# Patient Record
Sex: Male | Born: 1965 | Race: White | Hispanic: No | Marital: Married | Smoking: Never smoker
Health system: Southern US, Community
[De-identification: ages and names within clinical notes are randomized; demographics above are authoritative.]

## PROBLEM LIST (undated history)

## (undated) DIAGNOSIS — E119 Type 2 diabetes mellitus without complications: Secondary | ICD-10-CM

## (undated) DIAGNOSIS — I10 Essential (primary) hypertension: Secondary | ICD-10-CM

---

## 1999-12-24 ENCOUNTER — Emergency Department (HOSPITAL_COMMUNITY): Admission: EM | Admit: 1999-12-24 | Discharge: 1999-12-24 | Payer: Self-pay

## 1999-12-24 ENCOUNTER — Encounter: Payer: Self-pay | Admitting: Emergency Medicine

## 2017-09-08 HISTORY — PX: STENT PLACEMENT VASCULAR (ARMC HX): HXRAD1737

## 2017-12-21 ENCOUNTER — Encounter (HOSPITAL_COMMUNITY): Payer: Self-pay | Admitting: Emergency Medicine

## 2017-12-21 ENCOUNTER — Emergency Department (HOSPITAL_COMMUNITY)
Admission: EM | Admit: 2017-12-21 | Discharge: 2017-12-21 | Disposition: A | Discharge status: Home / Self Care | Payer: BLUE CROSS/BLUE SHIELD | Attending: Emergency Medicine | Admitting: Emergency Medicine

## 2017-12-21 ENCOUNTER — Other Ambulatory Visit: Payer: Self-pay

## 2017-12-21 ENCOUNTER — Emergency Department (HOSPITAL_COMMUNITY): Payer: BLUE CROSS/BLUE SHIELD

## 2017-12-21 DIAGNOSIS — Z7902 Long term (current) use of antithrombotics/antiplatelets: Secondary | ICD-10-CM | POA: Diagnosis not present

## 2017-12-21 DIAGNOSIS — Z79899 Other long term (current) drug therapy: Secondary | ICD-10-CM | POA: Insufficient documentation

## 2017-12-21 DIAGNOSIS — Y999 Unspecified external cause status: Secondary | ICD-10-CM | POA: Insufficient documentation

## 2017-12-21 DIAGNOSIS — Y9389 Activity, other specified: Secondary | ICD-10-CM | POA: Insufficient documentation

## 2017-12-21 DIAGNOSIS — E119 Type 2 diabetes mellitus without complications: Secondary | ICD-10-CM | POA: Diagnosis not present

## 2017-12-21 DIAGNOSIS — S93491A Sprain of other ligament of right ankle, initial encounter: Secondary | ICD-10-CM | POA: Diagnosis not present

## 2017-12-21 DIAGNOSIS — Y92002 Bathroom of unspecified non-institutional (private) residence single-family (private) house as the place of occurrence of the external cause: Secondary | ICD-10-CM | POA: Insufficient documentation

## 2017-12-21 DIAGNOSIS — I10 Essential (primary) hypertension: Secondary | ICD-10-CM | POA: Insufficient documentation

## 2017-12-21 DIAGNOSIS — S99911A Unspecified injury of right ankle, initial encounter: Secondary | ICD-10-CM | POA: Diagnosis present

## 2017-12-21 DIAGNOSIS — Z7984 Long term (current) use of oral hypoglycemic drugs: Secondary | ICD-10-CM | POA: Insufficient documentation

## 2017-12-21 DIAGNOSIS — X500XXA Overexertion from strenuous movement or load, initial encounter: Secondary | ICD-10-CM | POA: Diagnosis not present

## 2017-12-21 HISTORY — DX: Type 2 diabetes mellitus without complications: E11.9

## 2017-12-21 HISTORY — DX: Essential (primary) hypertension: I10

## 2017-12-21 MED ORDER — HYDROCODONE-ACETAMINOPHEN 5-325 MG PO TABS: 1.0000 | ORAL_TABLET | ORAL | 0 refills | Status: DC | PRN

## 2017-12-21 MED ORDER — HYDROCODONE-ACETAMINOPHEN 5-325 MG PO TABS: 1.0000 | ORAL_TABLET | ORAL | 0 refills | Status: AC | PRN

## 2017-12-21 MED ORDER — HYDROCODONE-ACETAMINOPHEN 5-325 MG PO TABS
1.0000 | ORAL_TABLET | Freq: Once | ORAL | Status: AC
  Administered 2017-12-21: 1 via ORAL
  Filled 2017-12-21: qty 1

## 2017-12-21 NOTE — ED Notes (Signed)
Ice pack given

## 2017-12-21 NOTE — ED Provider Notes (Signed)
[facility] EMERGENCY DEPARTMENT Provider Note   CSN: [id number] Arrival date & time: 12/21/17  1923     History   Chief Complaint Chief Complaint  Patient presents with  . Ankle Pain    right    HPI Ernest Davis is a 52 y.o. male presenting with right foot and  ankle pain which occurred suddenly when the patient rolled his foot early this am when walking.  Pain is aching, constant and worse with palpation, movement and weight bearing.  The patient was able to weight bear immediately after the event but his sx have worsened and is now unable to weight bear.  There is no radiation of pain and the patient denies numbness distal to the injury site.  The patients treatment prior to arrival included rest, ibuprofen and tylenol with no relief of pain. .  The history is provided by the patient.    Past Medical History:  Diagnosis Date  . Diabetes mellitus without complication (HCC)   . Hypertension     There are no active problems to display for this patient.     Home Medications    Prior to Admission medications   Medication Sig Start Date End Date Taking? Authorizing Provider  clopidogrel (PLAVIX) 75 MG tablet Take 75 mg by mouth daily.   Yes [provider]  glipiZIDE (GLUCOTROL) 10 MG tablet Take 10 mg by mouth 2 (two) times daily.   Yes [provider]  lisinopril-hydrochlorothiazide (PRINZIDE,ZESTORETIC) 20-25 MG tablet Take 1 tablet by mouth daily.   Yes [provider]  metFORMIN (GLUCOPHAGE) 500 MG tablet Take 500 mg by mouth 2 (two) times daily with a meal.   Yes [provider]  HYDROcodone-acetaminophen (NORCO/VICODIN) 5-325 MG tablet Take 1 tablet by mouth every 4 (four) hours as needed. 12/21/17   Burgess Amor, PA-C    Family History No family history on file.  Social History Social History   Tobacco Use  . Smoking status: Never Smoker  . Smokeless tobacco: Never Used  Substance Use Topics  . Alcohol use: No   Frequency: Never  . Drug use: No     Allergies   Jardiance [empagliflozin] and Penicillins   Review of Systems Review of Systems  Musculoskeletal: Positive for arthralgias and joint swelling.  Skin: Negative for wound.  Neurological: Negative for weakness and numbness.     Physical Exam Updated Vital Signs BP 121/72 (BP Location: Right Arm)   Pulse 95   Temp 98 F (36.7 C) (Oral)   Resp 16   Ht 5\' 10"  (1.778 m)   Wt 115.7 kg (255 lb)   SpO2 97%   BMI 36.59 kg/m   Physical Exam  Constitutional: He appears well-developed and well-nourished.  HENT:  Head: Normocephalic.  Cardiovascular: Normal rate and intact distal pulses. Exam reveals no decreased pulses.  Pulses:      Dorsalis pedis pulses are 2+ on the right side, and 2+ on the left side.  Musculoskeletal: He exhibits edema and tenderness.       Right ankle: He exhibits decreased range of motion, swelling and ecchymosis. He exhibits no deformity and normal pulse. Tenderness. Lateral malleolus and medial malleolus tenderness found. No head of 5th metatarsal and no proximal fibula tenderness found. Achilles tendon normal.       Right foot: There is tenderness and swelling. There is normal capillary refill, no crepitus and no deformity.  Neurological: He is alert. No sensory deficit.  Skin: Skin is warm, dry and  intact.  Nursing note and vitals reviewed.    ED Treatments / Results  Labs (all labs ordered are listed, but only abnormal results are displayed) Labs Reviewed - No data to display  EKG  EKG Interpretation None       Radiology Dg Ankle Complete Right  Result Date: 12/21/2017 CLINICAL DATA:  52 y/o M; fall with pain in the right foot and ankle. EXAM: RIGHT FOOT COMPLETE - 3+ VIEW; RIGHT ANKLE - COMPLETE 3+ VIEW COMPARISON:  None. FINDINGS: Right foot: Small ossific density adjacent to the fifth metatarsal head. Lisfranc alignment is maintained. Calcification projects over plantar fascia. Right ankle:  There is no evidence of fracture or dislocation. Talar dome is intact. Ankle mortise is symmetric on these nonstress views. Osteophyte production of talar neck may represent anterior ankle impingement. Dorsal calcaneal enthesophyte. IMPRESSION: 1. Small ossific density adjacent to fifth metatarsal head may represent ossicle or avulsion fracture. Correlation for focal tenderness recommended. 2. Calcification of plantar fascia may represent fasciitis. 3. Osteophyte production of talar neck may represent anterior ankle impingement in the appropriate clinical setting. 4. No ankle fracture or dislocation identified. Electronically Signed   By: Mitzi Hansen M.D.   On: 12/21/2017 21:20   Dg Foot Complete Right  Result Date: 12/21/2017 CLINICAL DATA:  52 y/o M; fall with pain in the right foot and ankle. EXAM: RIGHT FOOT COMPLETE - 3+ VIEW; RIGHT ANKLE - COMPLETE 3+ VIEW COMPARISON:  None. FINDINGS: Right foot: Small ossific density adjacent to the fifth metatarsal head. Lisfranc alignment is maintained. Calcification projects over plantar fascia. Right ankle: There is no evidence of fracture or dislocation. Talar dome is intact. Ankle mortise is symmetric on these nonstress views. Osteophyte production of talar neck may represent anterior ankle impingement. Dorsal calcaneal enthesophyte. IMPRESSION: 1. Small ossific density adjacent to fifth metatarsal head may represent ossicle or avulsion fracture. Correlation for focal tenderness recommended. 2. Calcification of plantar fascia may represent fasciitis. 3. Osteophyte production of talar neck may represent anterior ankle impingement in the appropriate clinical setting. 4. No ankle fracture or dislocation identified. Electronically Signed   By: Mitzi Hansen M.D.   On: 12/21/2017 21:20    Procedures Procedures (including critical care time)  Medications Ordered in ED Medications  HYDROcodone-acetaminophen (NORCO/VICODIN) 5-325 MG per  tablet 1 tablet (1 tablet Oral Given 12/21/17 2147)     Initial Impression / Assessment and Plan / ED Course  I have reviewed the triage vital signs and the nursing notes.  Pertinent labs & imaging results that were available during my care of the patient were reviewed by me and considered in my medical decision making (see chart for details).     Imaging reviewed and discussed with patient.  He has no point tenderness at his fifth MTP, doubt avulsion fracture.  He was placed in an Aircast, given crutches.  Discussed rest ice elevation.  He was given hydrocodone prepack for pain relief.  Wife is driving home.  [place]  He plans to follow-up with his PCP next week for recheck if his symptoms are not improving. Pt has chronic pain with neuropathy - he takes hydrocodone up to qid but left med at home as he was planning to be home tonight.  With this injury, will not be going home til tomorrow. He was given a pre pack for pain relief until he is home tomorrow.  [place] controlled substance database reviewed which confirms this chronic med.  Final Clinical Impressions(s) / ED Diagnoses   Final diagnoses:  Sprain of other ligament of right ankle, initial encounter    ED Discharge Orders        Ordered    HYDROcodone-acetaminophen (NORCO/VICODIN) 5-325 MG tablet  Every 4 hours PRN,   Status:  Discontinued     12/21/17 2155    HYDROcodone-acetaminophen (NORCO/VICODIN) 5-325 MG tablet  Every 4 hours PRN     12/21/17 2200       Burgess Amordol, Mattison Stuckey, PA-C 12/21/17 2205    Samuel JesterMcManus, Kathleen, DO 12/21/17 2355

## 2017-12-21 NOTE — ED Triage Notes (Signed)
Pt reports fell in the bathroom early this morning, denies LOC, hitting head or any other injuries, pr reports pain to right foot and ankle, pt has taken tylenol and ibuprofen with no relief, unable to apply pressure

## 2017-12-21 NOTE — Discharge Instructions (Signed)
Wear the air cast and use crutches to avoid weight bearing.  Use ice and elevation as much as possible for the next several days to help reduce the swelling.  Take the medications prescribed.  You may take the hydrocodone given for pain relief.  This will make you drowsy - do not drive within 4 hours of taking this medication.  Use the ibuprofen also for inflammation.  Call your doctor  for a recheck of your injury in 1 week if not improving.  You may benefit from physical therapy of your ankle and foot if it is not getting better over the next week.

## 2017-12-25 MED FILL — Hydrocodone-Acetaminophen Tab 5-325 MG: ORAL | Qty: 6 | Status: AC

## 2019-05-20 IMAGING — DX DG FOOT COMPLETE 3+V*R*
3 series · 3 of 3 positions shown · non-contrast
Comparison: None.

CLINICAL DATA: 51 y/o M; fall with pain in the right foot and
ankle.

EXAM:
RIGHT FOOT COMPLETE - 3+ VIEW; RIGHT ANKLE - COMPLETE 3+ VIEW

[foot ap]
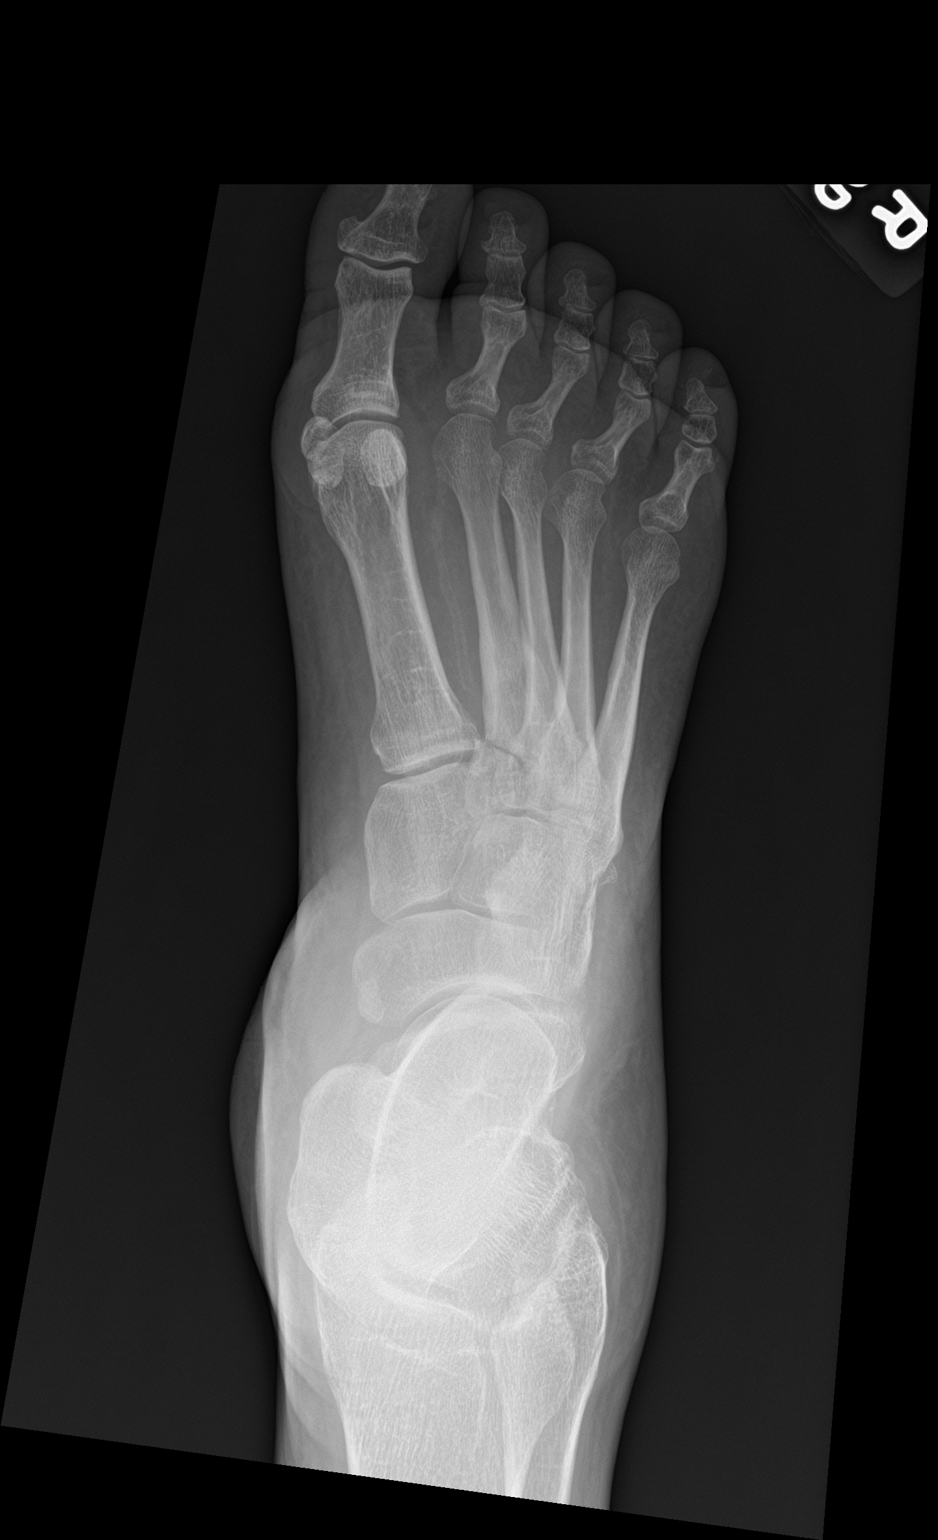

[foot obl]
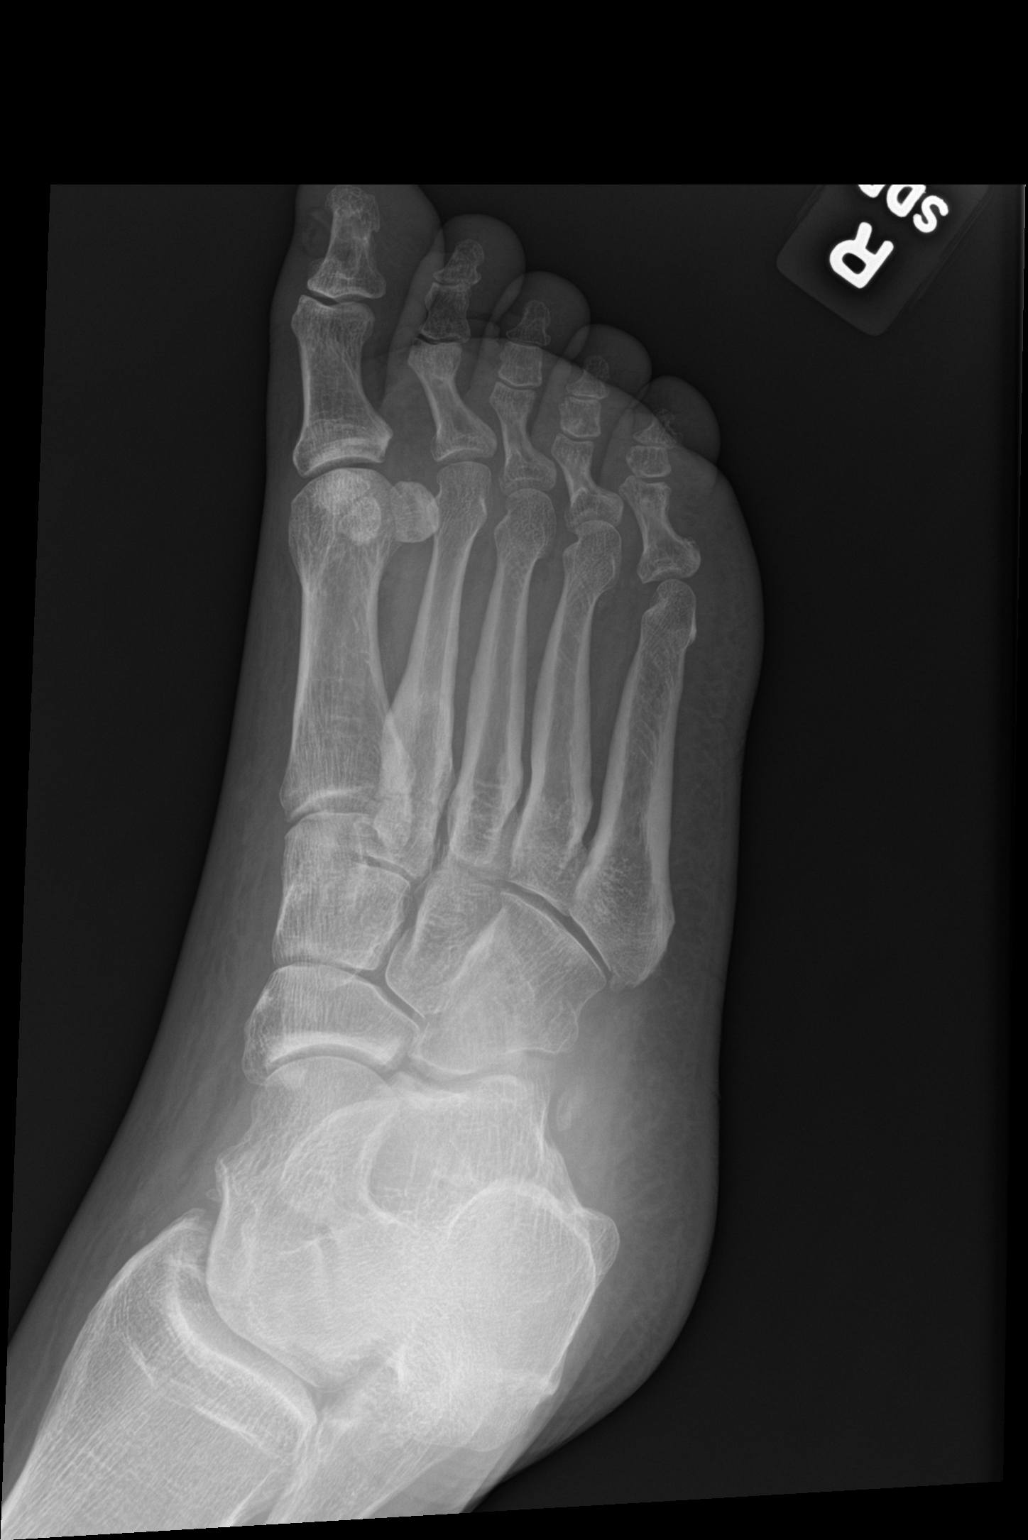

[foot lat]
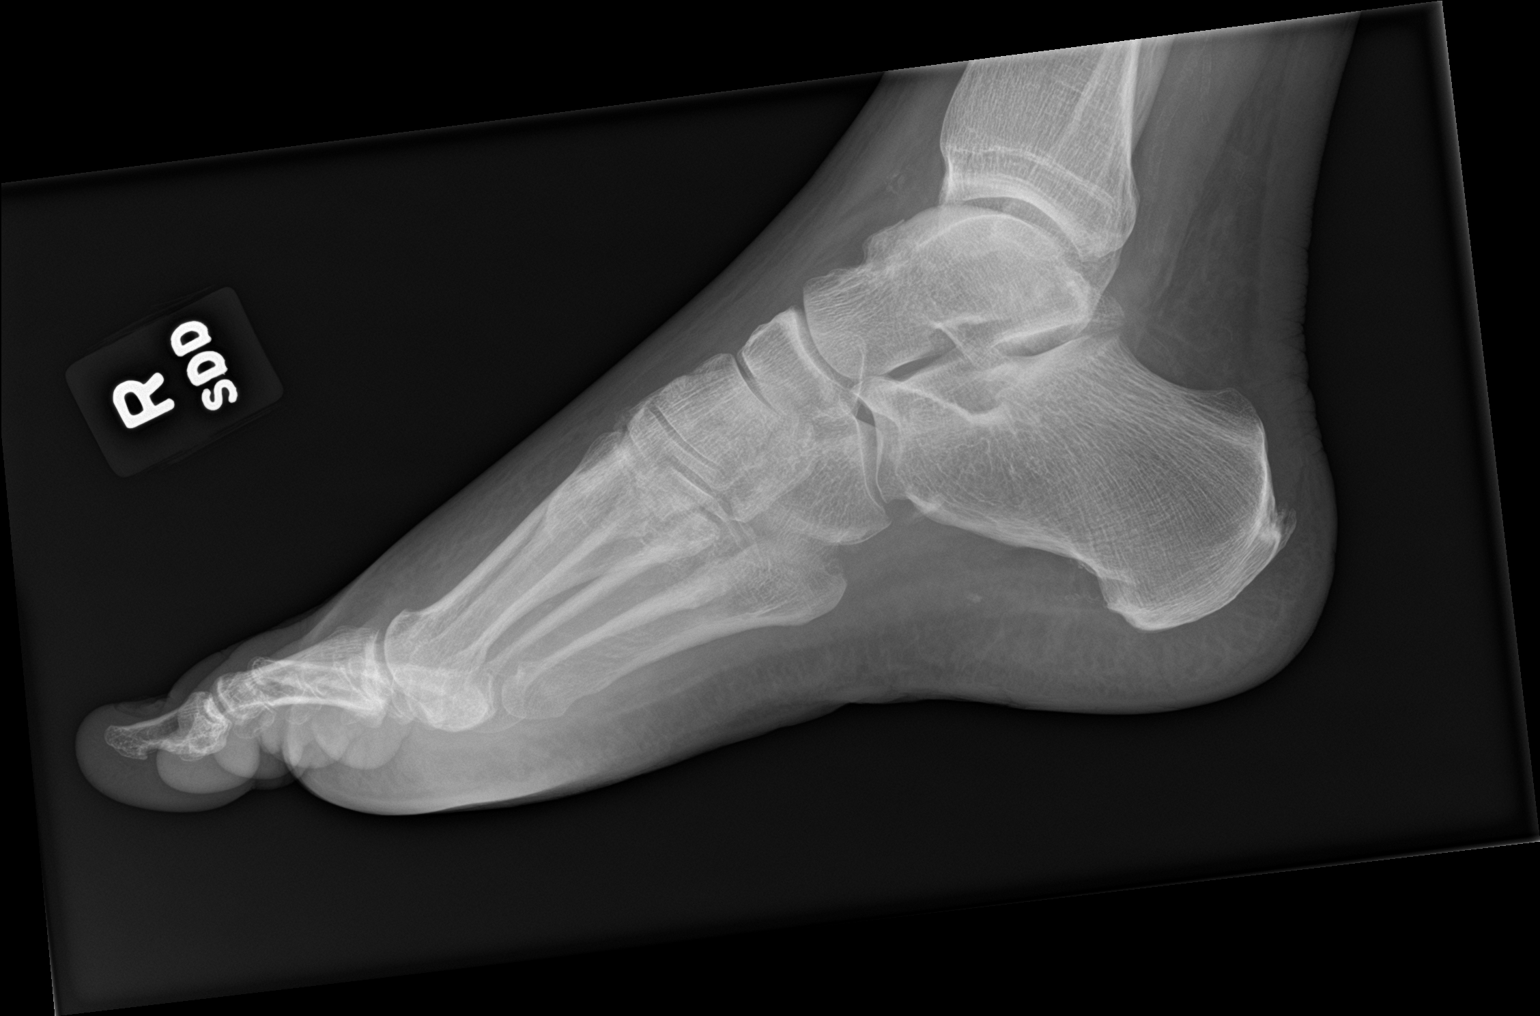

[3 of 3 positions shown; findings below may reference images not displayed]

FINDINGS: Right foot:

Small ossific density adjacent to the fifth metatarsal head.
Lisfranc alignment is maintained. Calcification projects over
plantar fascia.

Right ankle:

There is no evidence of fracture or dislocation. Talar dome is
intact. Ankle mortise is symmetric on these nonstress views.
Osteophyte production of talar neck may represent anterior ankle
impingement. Dorsal calcaneal enthesophyte.
IMPRESSION: 1. Small ossific density adjacent to fifth metatarsal head may
represent ossicle or avulsion fracture. Correlation for focal
tenderness recommended.
2. Calcification of plantar fascia may represent fasciitis.
3. Osteophyte production of talar neck may represent anterior ankle
impingement in the appropriate clinical setting.
4. No ankle fracture or dislocation identified.

By: Relindas Auad M.D.

## 2021-11-09 DEATH — deceased
